# Patient Record
Sex: Male | Born: 2000 | Race: Black or African American | Hispanic: No | Marital: Single | Smoking: Never smoker
Health system: Southern US, Community
[De-identification: ages and names within clinical notes are randomized; demographics above are authoritative.]

---
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
## 2021-09-29 ENCOUNTER — Ambulatory Visit
Admission: EM | Admit: 2021-09-29 | Discharge: 2021-09-29 | Disposition: A | Discharge status: Home / Self Care | Payer: PRIVATE HEALTH INSURANCE | Attending: Emergency Medicine | Admitting: Emergency Medicine

## 2021-09-29 ENCOUNTER — Encounter: Payer: Self-pay | Admitting: Emergency Medicine

## 2021-09-29 ENCOUNTER — Other Ambulatory Visit: Payer: Self-pay

## 2021-09-29 ENCOUNTER — Ambulatory Visit: Admit: 2021-09-29 | Discharge status: Absent without leave | Payer: Self-pay

## 2021-09-29 DIAGNOSIS — R059 Cough, unspecified: Secondary | ICD-10-CM | POA: Diagnosis not present

## 2021-09-29 DIAGNOSIS — J069 Acute upper respiratory infection, unspecified: Secondary | ICD-10-CM | POA: Insufficient documentation

## 2021-09-29 DIAGNOSIS — R0689 Other abnormalities of breathing: Secondary | ICD-10-CM | POA: Diagnosis present

## 2021-09-29 DIAGNOSIS — H66001 Acute suppurative otitis media without spontaneous rupture of ear drum, right ear: Secondary | ICD-10-CM | POA: Diagnosis not present

## 2021-09-29 DIAGNOSIS — R509 Fever, unspecified: Secondary | ICD-10-CM | POA: Insufficient documentation

## 2021-09-29 DIAGNOSIS — J029 Acute pharyngitis, unspecified: Secondary | ICD-10-CM | POA: Diagnosis not present

## 2021-09-29 LAB — POCT RAPID STREP A (OFFICE): Rapid Strep A Screen: NEGATIVE

## 2021-09-29 LAB — POCT INFLUENZA A/B
Influenza A, POC: NEGATIVE
Influenza B, POC: NEGATIVE

## 2021-09-29 MED ORDER — ACETAMINOPHEN 325 MG PO TABS
975.0000 mg | ORAL_TABLET | Freq: Once | ORAL | Status: AC
  Administered 2021-09-29: 975 mg via ORAL

## 2021-09-29 MED ORDER — ACETAMINOPHEN 325 MG PO TABS: 975.0000 mg | ORAL_TABLET | Freq: Once | ORAL | Status: DC

## 2021-09-29 MED ORDER — IPRATROPIUM BROMIDE 0.03 % NA SOLN: 2.0000 | Freq: Two times a day (BID) | NASAL | 0 refills | Status: AC

## 2021-09-29 MED ORDER — CEFDINIR 300 MG PO CAPS: 300.0000 mg | ORAL_CAPSULE | Freq: Two times a day (BID) | ORAL | 0 refills | Status: AC

## 2021-09-29 NOTE — ED Triage Notes (Addendum)
Patient presents to [facility] with mother for evaluation of sore throat, headache, cough, runny nose, fever x 2 days.  Denies n/v/d, or abdominal pain.  More sleepy than normal.  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

## 2021-09-29 NOTE — Discharge Instructions (Addendum)
Your rapid influenza and rapid strep tests today were both negative.  Throat culture will be performed per our protocol for definitive testing for streptococcal pharyngitis.    I have also recommended that you be tested for COVID and RSV which also includes a more sensitive influenza test, this was the second nose swab that we performed today, the results should be available in the next 2 to 3 days and you will be notified by phone, the result will also be posted to your MyChart.  On physical exam, you appear to have an ear infection in your right ear.  I therefore recommend that you begin antibiotics to treat this, I have sent a prescription for cefdinir to your pharmacy, please take 1 tablet twice daily.  If the result of your throat culture is positive for streptococcal pharyngitis, fortunately cefdinir will also treat this infection as well so no further medication will be required.  In the meantime, conservative care is also recommended as below.  Conservative care includes rest, pushing clear fluids and activity as tolerated.  You may also noticed that your appetite is reduced, this is okay as long as you are drinking plenty of clear fluids.  I have provided you with a note to return to school Monday.  Acetaminophen (Tylenol): This is a good fever reducer.  If your body temperature rises above 101.5 as measured with a thermometer, it is recommended that you take 1,000 mg every 6-8 hours until your temperature falls below 101.5, please not take more than 3,000 mg of acetaminophen either as a separate medication or as in ingredient in an over-the-counter cold/flu preparation within a 24-hour period  Ibuprofen  (Advil, Motrin): This is a good anti-inflammatory medication which addresses aches and pains and, to some degree, congestion in the nasal passages.  I recommend taking between 400 to 600 mg every 6-8 hours as needed.  Pseudoephedrine (Sudafed): This is a decongestant.  This medication has to  be purchased from the pharmacist counter, I recommend taking 2 tablets, 60 mg, 2-3 times a day as needed to relieve runny nose and sinus drainage.  Guaifenesin (Robitussin, Mucinex): This is an expectorant.  This helps break up chest congestion and loosen up thick nasal drainage making phlegm and drainage more liquid and therefore easier to remove.  I recommend taking 400 mg three times daily as needed.  Dextromethorphan (any cough medicine with the letters "DM" added to it's name such as Robitussin DM): This is a cough suppressant.  This is often recommended to be taken at nighttime to suppress cough and help people sleep.  Take as directed on the bottle.   Finally, I provided you with a prescription for nasal spray which should help dry up your mucous membranes and alleviate your symptoms of congestion and cough.  You can spray it into each nose up to 4 times daily, 2 sprays with each treatment.

## 2021-09-29 NOTE — ED Provider Notes (Signed)
[facility]    CSN: [id number] Arrival date & time: 09/29/21  0932      History   Chief Complaint Chief Complaint  Patient presents with   flu like symptoms    HPI Kile NIGEL ERICSSON is a 20 y.o. male.   Patient presents to [facility] with mother for evaluation of sore throat, headache, cough, runny nose, fever, states symptoms began yesterday.  Denies n/v/d, or abdominal pain, wheezing, shortness of breath, myalgia.  Mom states patient has been more sleepy than normal.  Patient states colleague on football team has been diagnosed with strep recently.  He does not have a history of allergies, asthma, frequent upper respiratory infections, hospitalization.  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  Mom states she has not given him any medications at home for symptoms.  The history is provided by the patient.   History reviewed. No pertinent past medical history.  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 No pertinent surgical history.     Home Medications    Prior to Admission medications   Medication Sig Start Date End Date Taking? Authorizing Provider  cefdinir (OMNICEF) 300 MG capsule Take 1 capsule (300 mg total) by mouth 2 (two) times daily for 10 days. 09/29/21 10/09/21 Yes Theadora Rama Scales, PA-C  ipratropium (ATROVENT) 0.03 % nasal spray Place 2 sprays into both nostrils every 12 (twelve) hours. 09/29/21  Yes Theadora Rama Scales, PA-C  acetaminophen (TYLENOL) 160 MG/5ML liquid Take by mouth every 4 (four) hours as needed.    [provider]  ibuprofen (ADVIL,MOTRIN) 100 MG/5ML suspension Take 5 mg/kg by mouth every 6 (six) hours as needed.    [provider]    Family History Family History  Problem Relation Age of Onset   Healthy Mother    Healthy Father     Social History Social History   Tobacco Use   Smoking status: Never   Smokeless tobacco:  Never  Substance Use Topics   Alcohol use: Never     Allergies   Patient has no known allergies.   Review of Systems Review of Systems Pertinent findings noted in history of present illness.    Physical Exam Triage Vital Signs ED Triage Vitals  Enc Vitals Group     BP 09/23/21 0827 (!) 147/82     Pulse Rate 09/23/21 0827 72     Resp 09/23/21 0827 18     Temp 09/23/21 0827 98.3 F (36.8 C)     Temp Source 09/23/21 0827 Oral     SpO2 09/23/21 0827 98 %     Weight --      Height --      Head Circumference --      Peak Flow --      Pain Score 09/23/21 0826 5     Pain Loc --      Pain Edu? --      Excl. in GC? --    No data found.  Updated Vital Signs BP 120/69 (BP Location: Right Arm)   Pulse 97   Temp (!) 102.7 F (39.3 C) (Oral)   Resp 18   SpO2 96%   Visual Acuity Right Eye Distance:   Left Eye Distance:   Bilateral Distance:    Right Eye Near:   Left Eye Near:    Bilateral Near:     Physical Exam Vitals and nursing note  reviewed.  Constitutional:      General: He is not in acute distress.    Appearance: Normal appearance. He is not ill-appearing.  HENT:     Head: Normocephalic and atraumatic.     Salivary Glands: Right salivary gland is not diffusely enlarged or tender. Left salivary gland is not diffusely enlarged or tender.     Right Ear: Ear canal and external ear normal. No drainage. A middle ear effusion (Suppurative) is present. There is no impacted cerumen. Tympanic membrane is erythematous and bulging.     Left Ear: Tympanic membrane, ear canal and external ear normal. No drainage.  No middle ear effusion. There is no impacted cerumen. Tympanic membrane is not erythematous or bulging.     Nose: Mucosal edema, congestion and rhinorrhea present. No nasal deformity or septal deviation. Rhinorrhea is clear.     Right Turbinates: Enlarged and swollen. Not pale.     Left Turbinates: Enlarged and swollen. Not pale.     Right Sinus: No maxillary  sinus tenderness or frontal sinus tenderness.     Left Sinus: No maxillary sinus tenderness or frontal sinus tenderness.     Mouth/Throat:     Lips: Pink. No lesions.     Mouth: Mucous membranes are moist. No oral lesions.     Pharynx: Oropharynx is clear. Uvula midline. Posterior oropharyngeal erythema present. No pharyngeal swelling, oropharyngeal exudate or uvula swelling.     Tonsils: No tonsillar exudate. 0 on the right. 0 on the left.  Eyes:     General: Lids are normal.        Right eye: No discharge.        Left eye: No discharge.     Extraocular Movements: Extraocular movements intact.     Conjunctiva/sclera: Conjunctivae normal.     Right eye: Right conjunctiva is not injected.     Left eye: Left conjunctiva is not injected.  Neck:     Trachea: Trachea and phonation normal.  Cardiovascular:     Rate and Rhythm: Normal rate and regular rhythm.     Pulses: Normal pulses.     Heart sounds: Normal heart sounds. No murmur heard.   No friction rub. No gallop.  Pulmonary:     Effort: Pulmonary effort is normal. No accessory muscle usage, prolonged expiration or respiratory distress.     Breath sounds: Normal breath sounds. No stridor, decreased air movement or transmitted upper airway sounds. No decreased breath sounds, wheezing, rhonchi or rales.  Chest:     Chest wall: No tenderness.  Musculoskeletal:        General: Normal range of motion.     Cervical back: Normal range of motion and neck supple. Normal range of motion.  Lymphadenopathy:     Cervical: No cervical adenopathy.  Skin:    General: Skin is warm and dry.     Findings: No erythema or rash.  Neurological:     General: No focal deficit present.     Mental Status: He is alert and oriented to person, place, and time.  Psychiatric:        Mood and Affect: Mood normal.        Behavior: Behavior normal.     UC Treatments / Results  Labs (all labs ordered are listed, but only abnormal results are  displayed) Labs Reviewed  CULTURE, GROUP A STREP ([facility])  COVID-19, FLU A+B AND RSV  POCT INFLUENZA A/B  POCT RAPID STREP A (OFFICE)    EKG  Radiology No results found.  Procedures Procedures (including critical care time)  Medications Ordered in UC Medications  acetaminophen (TYLENOL) tablet 975 mg (975 mg Oral Not Given 09/29/21 1307)  acetaminophen (TYLENOL) tablet 975 mg (975 mg Oral Given 09/29/21 1240)    Initial Impression / Assessment and Plan / UC Course  I have reviewed the triage vital signs and the nursing notes.  Pertinent labs & imaging results that were available during my care of the patient were reviewed by me and considered in my medical decision making (see chart for details).     Patient tested for influenza and streptococcal pharyngitis, both rapid test were negative.  COVID/flu/RSV tests were also performed and throat culture will be sent per protocol.  Based on findings for acute otitis media in right ear, patient was started on 10-day course of cefdinir, patient was also provided with a prescription for Atrovent nasal spray to help dry up mucous membranes and decrease cough.  Conservative care was also recommended.  Patient verbalized understanding and agreement of plan as discussed.  All questions were addressed during visit.  Please see discharge instructions below for further details of plan.  Final Clinical Impressions(s) / UC Diagnoses   Final diagnoses:  Fever, unspecified  Acute pharyngitis, unspecified etiology  Cough, unspecified type  Acute upper respiratory infection  Decreased breath sounds  Right acute suppurative otitis media     Discharge Instructions      Your rapid influenza and rapid strep tests today were both negative.  Throat culture will be performed per our protocol for definitive testing for streptococcal pharyngitis.    I have also recommended that you be tested for COVID and RSV which also includes a more sensitive  influenza test, this was the second nose swab that we performed today, the results should be available in the next 2 to 3 days and you will be notified by phone, the result will also be posted to your MyChart.  On physical exam, you appear to have an ear infection in your right ear.  I therefore recommend that you begin antibiotics to treat this, I have sent a prescription for cefdinir to your pharmacy, please take 1 tablet twice daily.  If the result of your throat culture is positive for streptococcal pharyngitis, fortunately cefdinir will also treat this infection as well so no further medication will be required.  In the meantime, conservative care is also recommended as below.  Conservative care includes rest, pushing clear fluids and activity as tolerated.  You may also noticed that your appetite is reduced, this is okay as long as you are drinking plenty of clear fluids.  I have provided you with a note to return to school Monday.  Acetaminophen (Tylenol): This is a good fever reducer.  If your body temperature rises above 101.5 as measured with a thermometer, it is recommended that you take 1,000 mg every 6-8 hours until your temperature falls below 101.5, please not take more than 3,000 mg of acetaminophen either as a separate medication or as in ingredient in an over-the-counter cold/flu preparation within a 24-hour period  Ibuprofen  (Advil, Motrin): This is a good anti-inflammatory medication which addresses aches and pains and, to some degree, congestion in the nasal passages.  I recommend taking between 400 to 600 mg every 6-8 hours as needed.  Pseudoephedrine (Sudafed): This is a decongestant.  This medication has to be purchased from the pharmacist counter, I recommend taking 2 tablets, 60 mg, 2-3 times a day  as needed to relieve runny nose and sinus drainage.  Guaifenesin (Robitussin, Mucinex): This is an expectorant.  This helps break up chest congestion and loosen up thick nasal  drainage making phlegm and drainage more liquid and therefore easier to remove.  I recommend taking 400 mg three times daily as needed.  Dextromethorphan (any cough medicine with the letters "DM" added to it's name such as Robitussin DM): This is a cough suppressant.  This is often recommended to be taken at nighttime to suppress cough and help people sleep.  Take as directed on the bottle.   Finally, I provided you with a prescription for nasal spray which should help dry up your mucous membranes and alleviate your symptoms of congestion and cough.  You can spray it into each nose up to 4 times daily, 2 sprays with each treatment.     ED Prescriptions     Medication Sig Dispense Auth. Provider   ipratropium (ATROVENT) 0.03 % nasal spray Place 2 sprays into both nostrils every 12 (twelve) hours. 30 mL Theadora Rama Scales, PA-C   cefdinir (OMNICEF) 300 MG capsule Take 1 capsule (300 mg total) by mouth 2 (two) times daily for 10 days. 20 capsule Theadora Rama Scales, PA-C      PDMP not reviewed this encounter.    Theadora Rama Scales, PA-C 09/29/21 1337

## 2021-10-01 LAB — COVID-19, FLU A+B AND RSV
Influenza A, NAA: DETECTED — AB
Influenza B, NAA: NOT DETECTED
RSV, NAA: NOT DETECTED
SARS-CoV-2, NAA: NOT DETECTED

## 2021-10-02 LAB — CULTURE, GROUP A STREP (THRC)
